# Patient Record
Sex: Male | Born: 1970 | Race: White | Hispanic: No | Marital: Married | Smoking: Current every day smoker
Health system: Southern US, Community
[De-identification: ages and names within clinical notes are randomized; demographics above are authoritative.]

## PROBLEM LIST (undated history)

## (undated) HISTORY — PX: KNEE SURGERY: SHX244

## (undated) HISTORY — PX: TOTAL HIP ARTHROPLASTY: SHX124

---

## 1999-03-09 ENCOUNTER — Encounter: Payer: Self-pay | Admitting: Emergency Medicine

## 1999-03-09 ENCOUNTER — Emergency Department (HOSPITAL_COMMUNITY): Admission: EM | Admit: 1999-03-09 | Discharge: 1999-03-09 | Payer: Self-pay | Admitting: Emergency Medicine

## 2000-02-02 ENCOUNTER — Inpatient Hospital Stay (HOSPITAL_COMMUNITY): Admission: RE | Admit: 2000-02-02 | Discharge: 2000-02-05 | Payer: Self-pay | Admitting: Orthopaedic Surgery

## 2000-05-11 ENCOUNTER — Encounter: Payer: Self-pay | Admitting: Emergency Medicine

## 2000-05-11 ENCOUNTER — Emergency Department (HOSPITAL_COMMUNITY): Admission: EM | Admit: 2000-05-11 | Discharge: 2000-05-11 | Payer: Self-pay | Admitting: Emergency Medicine

## 2000-07-21 ENCOUNTER — Inpatient Hospital Stay (HOSPITAL_COMMUNITY): Admission: RE | Admit: 2000-07-21 | Discharge: 2000-07-24 | Payer: Self-pay | Admitting: Orthopaedic Surgery

## 2002-06-03 ENCOUNTER — Emergency Department (HOSPITAL_COMMUNITY): Admission: EM | Admit: 2002-06-03 | Discharge: 2002-06-04 | Payer: Self-pay | Admitting: Emergency Medicine

## 2002-06-04 ENCOUNTER — Encounter: Payer: Self-pay | Admitting: Emergency Medicine

## 2005-02-14 ENCOUNTER — Ambulatory Visit: Payer: Self-pay | Admitting: Family Medicine

## 2005-09-11 ENCOUNTER — Ambulatory Visit: Payer: Self-pay | Admitting: Internal Medicine

## 2011-06-18 ENCOUNTER — Ambulatory Visit: Payer: Self-pay | Admitting: Family Medicine

## 2011-06-18 DIAGNOSIS — Z0289 Encounter for other administrative examinations: Secondary | ICD-10-CM

## 2012-04-30 ENCOUNTER — Emergency Department (HOSPITAL_BASED_OUTPATIENT_CLINIC_OR_DEPARTMENT_OTHER): Payer: Self-pay

## 2012-04-30 ENCOUNTER — Emergency Department (HOSPITAL_BASED_OUTPATIENT_CLINIC_OR_DEPARTMENT_OTHER)
Admission: EM | Admit: 2012-04-30 | Discharge: 2012-04-30 | Disposition: A | Discharge status: Home / Self Care | Payer: Self-pay | Attending: Emergency Medicine | Admitting: Emergency Medicine

## 2012-04-30 ENCOUNTER — Encounter (HOSPITAL_BASED_OUTPATIENT_CLINIC_OR_DEPARTMENT_OTHER): Payer: Self-pay | Admitting: *Deleted

## 2012-04-30 DIAGNOSIS — Y929 Unspecified place or not applicable: Secondary | ICD-10-CM | POA: Insufficient documentation

## 2012-04-30 DIAGNOSIS — S61219A Laceration without foreign body of unspecified finger without damage to nail, initial encounter: Secondary | ICD-10-CM

## 2012-04-30 DIAGNOSIS — F17200 Nicotine dependence, unspecified, uncomplicated: Secondary | ICD-10-CM | POA: Insufficient documentation

## 2012-04-30 DIAGNOSIS — W230XXA Caught, crushed, jammed, or pinched between moving objects, initial encounter: Secondary | ICD-10-CM | POA: Insufficient documentation

## 2012-04-30 DIAGNOSIS — Y9389 Activity, other specified: Secondary | ICD-10-CM | POA: Insufficient documentation

## 2012-04-30 DIAGNOSIS — S61209A Unspecified open wound of unspecified finger without damage to nail, initial encounter: Secondary | ICD-10-CM | POA: Insufficient documentation

## 2012-04-30 NOTE — ED Provider Notes (Addendum)
History     CSN: [id number]  Arrival date & time 04/30/12  1851   First MD Initiated Contact with Patient 04/30/12 1927      Chief Complaint  Patient presents with  . Laceration    (Consider location/radiation/quality/duration/timing/severity/associated sxs/prior treatment) Patient is a 42 y.o. male presenting with skin laceration. The history is provided by the patient.  Laceration Location:  Finger Finger laceration location:  L index finger Length (cm):  0.5 Depth:  Through dermis Laceration mechanism:  Blunt object Pain details:    Severity:  No pain Foreign body present:  No foreign bodies Tetanus status:  Out of date  Pt crushed finger between two pieces of wood History reviewed. No pertinent past medical history.  History reviewed. No pertinent past surgical history.  History reviewed. No pertinent family history.  History  Substance Use Topics  . Smoking status: Current Every Day Smoker  . Smokeless tobacco: Not on file  . Alcohol Use: No      Review of Systems  Skin: Positive for wound.  All other systems reviewed and are negative.    Allergies  Review of patient's allergies indicates no known allergies.  Home Medications  No current outpatient prescriptions on file.  BP 100/67  Pulse 72  Temp(Src) 97.6 F (36.4 C) (Oral)  Resp 20  Ht 5\' 10"  (1.778 m)  Wt 140 lb (63.504 kg)  BMI 20.09 kg/m2  SpO2 99%  Physical Exam  Nursing note and vitals reviewed. Constitutional: He is oriented to person, place, and time. He appears well-developed and well-nourished.  Musculoskeletal: He exhibits tenderness.  5mm laceration to left index finger  Neurological: He is alert and oriented to person, place, and time. He has normal reflexes.  Skin: Skin is warm.  Psychiatric: He has a normal mood and affect.    ED Course  LACERATION REPAIR Date/Time: 04/30/2012 11:19 PM Performed by: Elson Areas Authorized by: Elson Areas Consent: Verbal  consent obtained. Consent given by: patient Patient identity confirmed: verbally with patient Time out: Immediately prior to procedure a "time out" was called to verify the correct patient, procedure, equipment, support staff and site/side marked as required. Body area: upper extremity Location details: left index finger Laceration length: 0.5 cm Foreign bodies: no foreign bodies Tendon involvement: none Nerve involvement: none Anesthesia: local infiltration Skin closure: 5-0 Prolene Number of sutures: 2 Technique: simple Approximation: loose Approximation difficulty: simple Patient tolerance: Patient tolerated the procedure well with no immediate complications.   (including critical care time)  Labs Reviewed - No data to display Dg Hand Complete Left  04/30/2012  *RADIOLOGY REPORT*  Clinical Data: The patient was cutting wood. Left hand got pinched. Laceration at the base of the index finger.  LEFT HAND - COMPLETE 3+ VIEW  Comparison: None.  Findings: Three views are performed, showing no evidence for acute fracture or subluxation.  No radiopaque foreign body or soft tissue gas identified.  IMPRESSION: Negative exam.   Original Report Authenticated By: Norva Pavlov, M.D.      1. Laceration of finger of left hand, initial encounter       MDM  Suture removal in 8 days       Elson Areas, PA-C 04/30/12 2026  Lonia Skinner [place], PA-C 05/23/12 2321

## 2012-04-30 NOTE — ED Notes (Signed)
Pt states he was cutting wood and his left hand got "pinched". Approx 2 cm lac to base of left index finger. Bleeding controlled. CMS intact. Ring removed and given to wife.

## 2012-04-30 NOTE — ED Provider Notes (Signed)
Medical screening examination/treatment/procedure(s) were performed by non-physician practitioner and as supervising physician I was immediately available for consultation/collaboration.   Loren Racer, MD 04/30/12 [phone]

## 2012-05-25 NOTE — ED Provider Notes (Signed)
Medical screening examination/treatment/procedure(s) were performed by non-physician practitioner and as supervising physician I was immediately available for consultation/collaboration.   Loren Racer, MD 05/25/12 [phone]

## 2013-09-08 IMAGING — CR DG HAND COMPLETE 3+V*L*
3 series · 3 of 3 positions shown · non-contrast
Comparison: None.

CLINICAL DATA: The patient was cutting wood. Left hand got pinched.
Laceration at the base of the index finger.

LEFT HAND - COMPLETE 3+ VIEW

[x hand pa left]
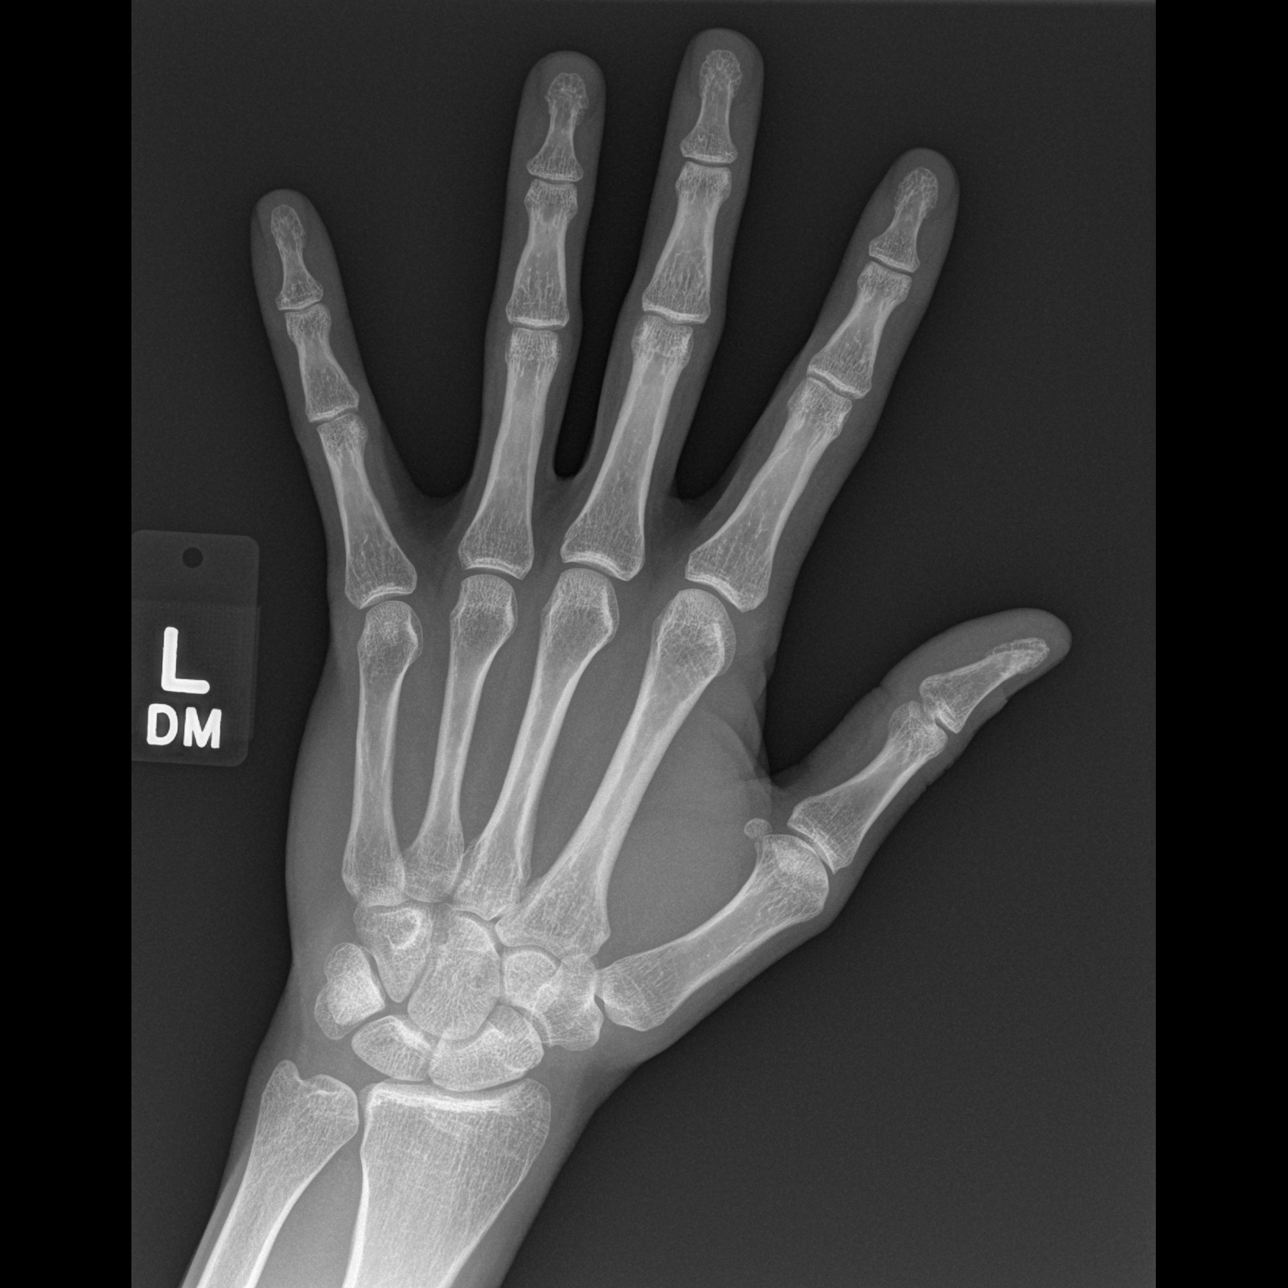

[x hand oblique left]
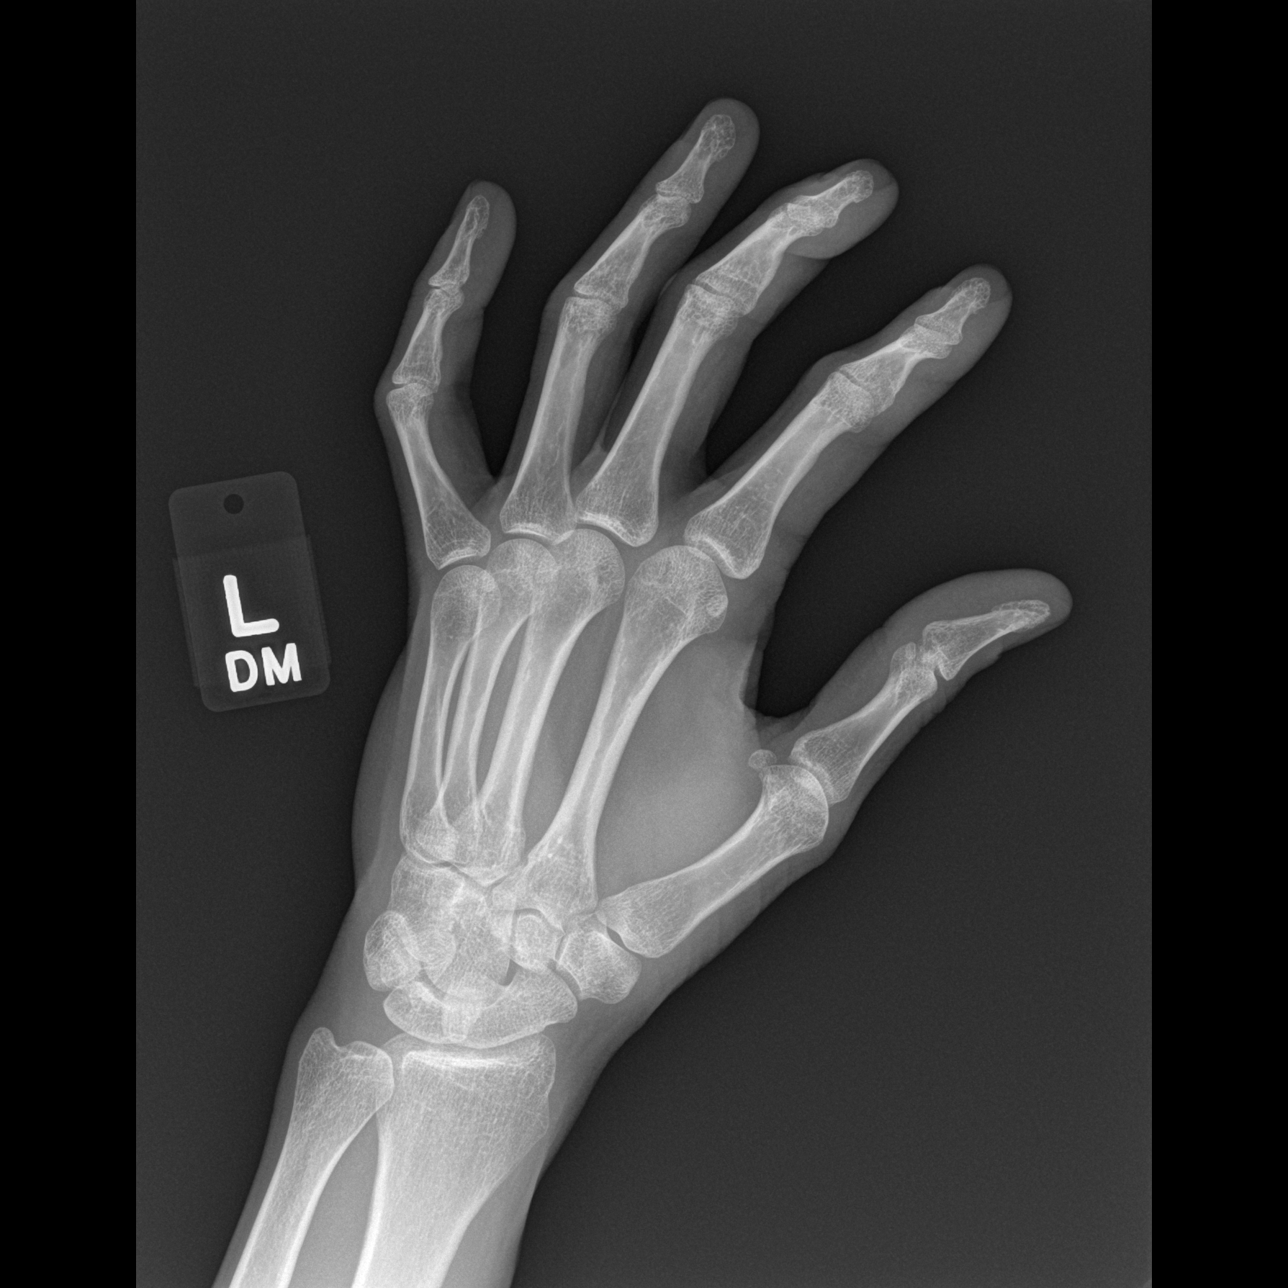

[x hand lat left]
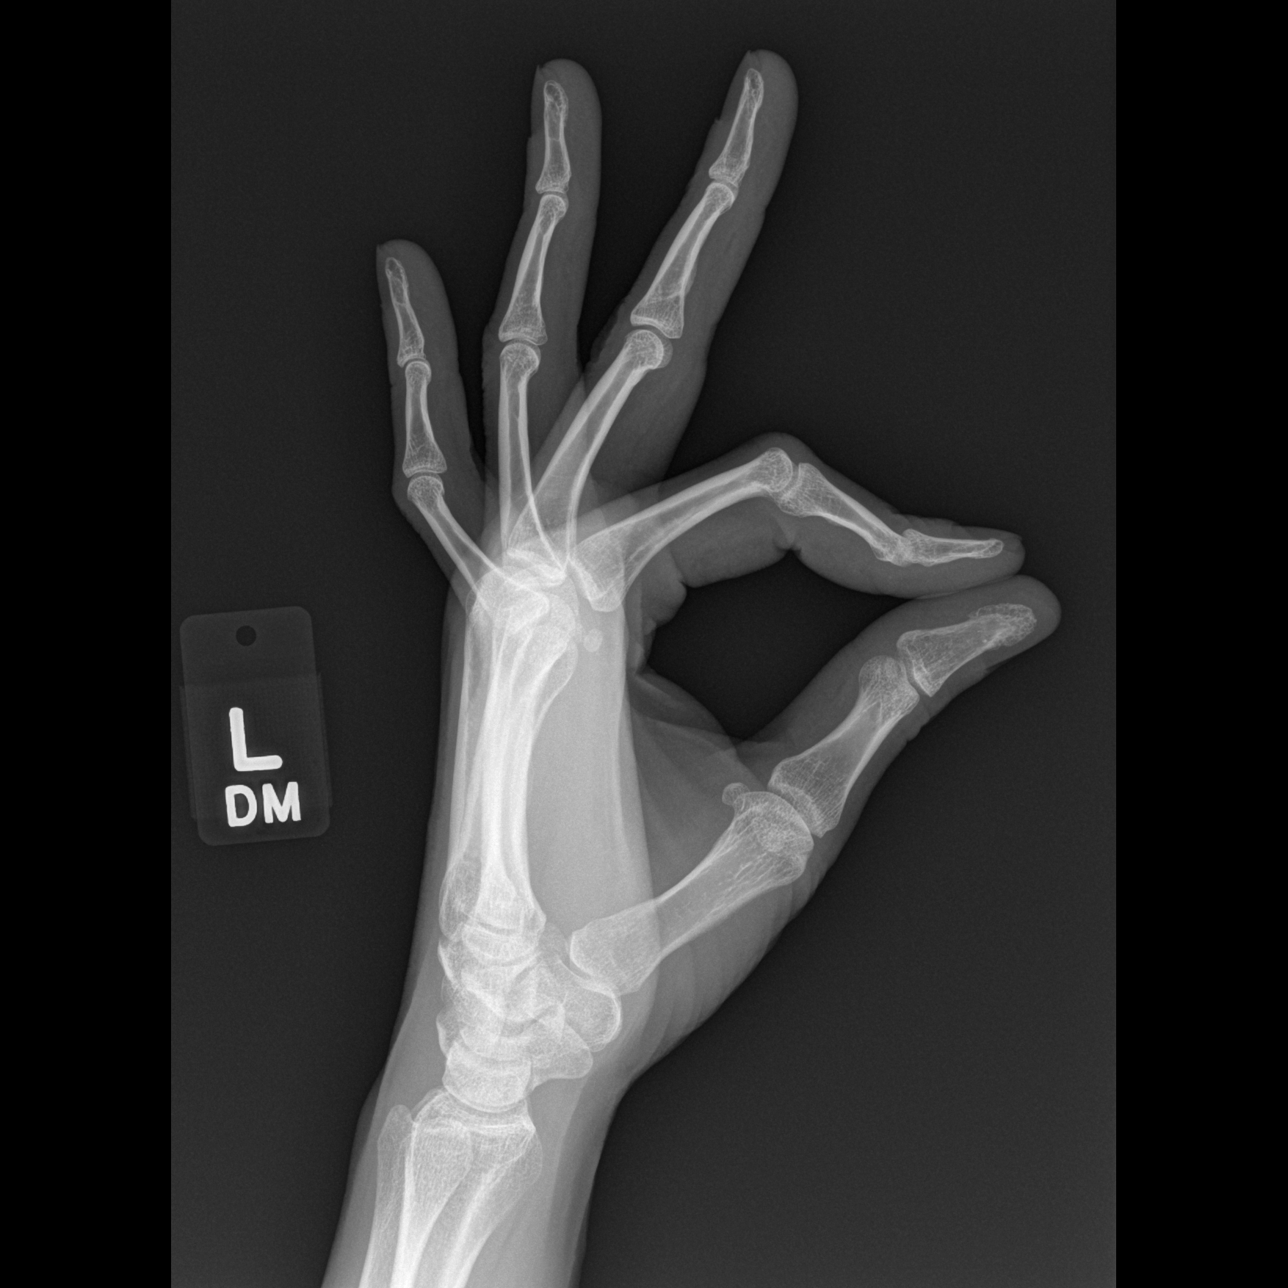

[3 of 3 positions shown; findings below may reference images not displayed]

FINDINGS: Three views are performed, showing no evidence for acute
fracture or subluxation.  No radiopaque foreign body or soft tissue
gas identified.
IMPRESSION: Negative exam.

## 2015-11-18 ENCOUNTER — Ambulatory Visit (INDEPENDENT_AMBULATORY_CARE_PROVIDER_SITE_OTHER): Payer: 59 | Admitting: Orthopaedic Surgery

## 2015-11-18 DIAGNOSIS — M25552 Pain in left hip: Secondary | ICD-10-CM | POA: Diagnosis not present

## 2017-07-05 ENCOUNTER — Ambulatory Visit: Payer: Self-pay | Admitting: Family Medicine

## 2017-07-19 ENCOUNTER — Encounter: Payer: Self-pay | Admitting: Family Medicine

## 2017-07-19 ENCOUNTER — Ambulatory Visit (INDEPENDENT_AMBULATORY_CARE_PROVIDER_SITE_OTHER): Payer: BLUE CROSS/BLUE SHIELD | Admitting: Family Medicine

## 2017-07-19 VITALS — BP 92/62 | HR 68 | Ht 70.0 in | Wt 135.8 lb

## 2017-07-19 DIAGNOSIS — E786 Lipoprotein deficiency: Secondary | ICD-10-CM | POA: Diagnosis not present

## 2017-07-19 DIAGNOSIS — F17290 Nicotine dependence, other tobacco product, uncomplicated: Secondary | ICD-10-CM

## 2017-07-19 DIAGNOSIS — F17200 Nicotine dependence, unspecified, uncomplicated: Secondary | ICD-10-CM | POA: Insufficient documentation

## 2017-07-19 NOTE — Progress Notes (Signed)
Jason Yates - 47 y.o. male MRN [id number]  Date of birth: 1970-03-22  Subjective Chief Complaint  Patient presents with  . Abnormal Lab    HPI Jason Yates is a 47 y.o. male here today to establish care with new pcp.  He has not seen a pcp in a few years but has been fairly healthy.  He has no known medical problems however is concerned about recent labs that he had completed through work.  His main concern is that his HDL is on the low end of normal (35) and he is not sure what this means.  He reports a family history of heart disease and wanted to be sure he didn't need to start medication.  He does unfortunately use nicotine products (vaping) and has not interest in quitting this at this time.  He has not had any anginal symptoms, shortness of breath, palpitations, dizziness or headache.   Outside labs sent for scanning.   ROS: ROS completed and negative except as noted per HPI  No Known Allergies  History reviewed. No pertinent past medical history.  Past Surgical History:  Procedure Laterality Date  . KNEE SURGERY    . TOTAL HIP ARTHROPLASTY      Social History   Socioeconomic History  . Marital status: Married    Spouse name: Not on file  . Number of children: Not on file  . Years of education: Not on file  . Highest education level: Not on file  Occupational History  . Not on file  Social Needs  . Financial resource strain: Not on file  . Food insecurity:    Worry: Not on file    Inability: Not on file  . Transportation needs:    Medical: Not on file    Non-medical: Not on file  Tobacco Use  . Smoking status: Current Every Day Smoker    Types: E-cigarettes  . Smokeless tobacco: Never Used  Substance and Sexual Activity  . Alcohol use: No  . Drug use: No  . Sexual activity: Not on file  Lifestyle  . Physical activity:    Days per week: Not on file    Minutes per session: Not on file  . Stress: Not on file  Relationships  . Social connections:    Talks  on phone: Not on file    Gets together: Not on file    Attends religious service: Not on file    Active member of club or organization: Not on file    Attends meetings of clubs or organizations: Not on file    Relationship status: Not on file  Other Topics Concern  . Not on file  Social History Narrative  . Not on file    History reviewed. No pertinent family history.  Health Maintenance  Topic Date Due  . HIV Screening  05/17/1985  . TETANUS/TDAP  05/17/1989  . INFLUENZA VACCINE  09/16/2017    ----------------------------------------------------------------------------------------------------------------------------------------------------------------------------------------------------------------- Physical Exam BP 92/62   Pulse 68   Ht 5\' 10"  (1.778 m)   Wt 135 lb 12.8 oz (61.6 kg)   BMI 19.49 kg/m   Physical Exam  Constitutional: He is oriented to person, place, and time. He appears well-nourished. No distress.  HENT:  Head: Normocephalic and atraumatic.  Mouth/Throat: Oropharynx is clear and moist.  Eyes: No scleral icterus.  Neck: No thyromegaly present.  Cardiovascular: Normal rate, regular rhythm and normal heart sounds.  Pulmonary/Chest: Effort normal and breath sounds normal.  Lymphadenopathy:    He has  no cervical adenopathy.  Neurological: He is oriented to person, place, and time.  Psychiatric: He has a normal mood and affect. His behavior is normal.    ------------------------------------------------------------------------------------------------------------------------------------------------------------------------------------------------------------------- Assessment and Plan  Nicotine dependence He was briefly counseled on cessation of vaping however has not interest in quitting at this time.   Low HDL (under 40) Explained differences in different cholesterol types and that HDL is preferred to be higher for cardioprotection.   Discussed ways to  improve this including regular aerobic exercise He is concerned about heart disease and I discussed with him that his biggest risk factor for heart disease is nicotine use and advised quitting.

## 2017-07-19 NOTE — Assessment & Plan Note (Signed)
Explained differences in different cholesterol types and that HDL is preferred to be higher for cardioprotection.   Discussed ways to improve this including regular aerobic exercise He is concerned about heart disease and I discussed with him that his biggest risk factor for heart disease is nicotine use and advised quitting.

## 2017-07-19 NOTE — Assessment & Plan Note (Signed)
He was briefly counseled on cessation of vaping however has not interest in quitting at this time.

## 2017-07-19 NOTE — Patient Instructions (Signed)
It was nice to meet you!  Cholesterol Cholesterol is a white, waxy, fat-like substance that is needed by the human body in small amounts. The liver makes all the cholesterol we need. Cholesterol is carried from the liver by the blood through the blood vessels. Deposits of cholesterol (plaques) may build up on blood vessel (artery) walls. Plaques make the arteries narrower and stiffer. Cholesterol plaques increase the risk for heart attack and stroke. You cannot feel your cholesterol level even if it is very high. The only way to know that it is high is to have a blood test. Once you know your cholesterol levels, you should keep a record of the test results. Work with your health care provider to keep your levels in the desired range. What do the results mean?  Total cholesterol is a rough measure of all the cholesterol in your blood.  LDL (low-density lipoprotein) is the "bad" cholesterol. This is the type that causes plaque to build up on the artery walls. You want this level to be low.  HDL (high-density lipoprotein) is the "good" cholesterol because it cleans the arteries and carries the LDL away. You want this level to be high.  Triglycerides are fat that the body can either burn for energy or store. High levels are closely linked to heart disease. What are the desired levels of cholesterol?  Total cholesterol below 200.  LDL below 100 for people who are at risk, below 70 for people at very high risk.  HDL above 40 is good. A level of 60 or higher is considered to be protective against heart disease.  Triglycerides below 150. How can I lower my cholesterol? Diet Follow your diet program as told by your health care provider.  Choose fish or white meat chicken and [place], roasted or baked. Limit fatty cuts of red meat, fried foods, and processed meats, such as sausage and lunch meats.  Eat lots of fresh fruits and vegetables.  Choose whole grains, beans, pasta, potatoes, and  cereals.  Choose olive oil, corn oil, or canola oil, and use only small amounts.  Avoid butter, mayonnaise, shortening, or palm kernel oils.  Avoid foods with trans fats.  Drink skim or nonfat milk and eat low-fat or nonfat yogurt and cheeses. Avoid whole milk, cream, ice cream, egg yolks, and full-fat cheeses.  Healthier desserts include angel food cake, ginger snaps, animal crackers, hard candy, popsicles, and low-fat or nonfat frozen yogurt. Avoid pastries, cakes, pies, and cookies.  Exercise  Follow your exercise program as told by your health care provider. A regular program: ? Helps to decrease LDL and raise HDL. ? Helps with weight control.  Do things that increase your activity level, such as gardening, walking, and taking the stairs.  Ask your health care provider about ways that you can be more active in your daily life.  Medicine  Take over-the-counter and prescription medicines only as told by your health care provider. ? Medicine may be prescribed by your health care provider to help lower cholesterol and decrease the risk for heart disease. This is usually done if diet and exercise have failed to bring down cholesterol levels. ? If you have several risk factors, you may need medicine even if your levels are normal.  This information is not intended to replace advice given to you by your health care provider. Make sure you discuss any questions you have with your health care provider. Document Released: 10/28/2000 Document Revised: 08/31/2015 Document Reviewed: 08/03/2015 Elsevier Interactive Patient  Education  2018 [employer]  

## 2018-01-17 ENCOUNTER — Ambulatory Visit: Payer: BLUE CROSS/BLUE SHIELD | Admitting: Family Medicine

## 2018-01-17 ENCOUNTER — Encounter: Payer: Self-pay | Admitting: Family Medicine

## 2018-01-17 DIAGNOSIS — F524 Premature ejaculation: Secondary | ICD-10-CM | POA: Diagnosis not present

## 2018-01-17 MED ORDER — PAROXETINE HCL 10 MG PO TABS: 10.0000 mg | ORAL_TABLET | Freq: Every day | ORAL | 1 refills | Status: DC

## 2018-01-17 NOTE — Progress Notes (Signed)
Jason Yates - 47 y.o. male MRN [id number]  Date of birth: 05/16/1970  Subjective Chief Complaint  Patient presents with  . 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 male here today with complaint of sexual dysfunction.  He reports that he is having problems with premature ejaculation.  He states that this started a few months ago.  Up until that point he did not have any problems.  He reports that from penetration to ejaculation it takes 1-2 minutes, sometimes less.  He denies any difficulty achieving or maintaining an erection.  He denies new sexual partners.  He denies pain with ejaculation.  He has not tried any treatments so far.   ROS:  A comprehensive ROS was completed and negative except as noted per HPI  No Known Allergies  No past medical history on file.  Past Surgical History:  Procedure Laterality Date  . KNEE SURGERY    . TOTAL HIP ARTHROPLASTY      Social History   Socioeconomic History  . Marital status: Married    Spouse name: Not on file  . Number of children: Not on file  . Years of education: Not on file  . Highest education level: Not on file  Occupational History  . Not on file  Social Needs  . Financial resource strain: Not on file  . Food insecurity:    Worry: Not on file    Inability: Not on file  . Transportation needs:    Medical: Not on file    Non-medical: Not on file  Tobacco Use  . Smoking status: Current Every Day Smoker    Types: E-cigarettes  . Smokeless tobacco: Never Used  Substance and Sexual Activity  . Alcohol use: No  . Drug use: No  . Sexual activity: Not on file  Lifestyle  . Physical activity:    Days per week: Not on file    Minutes per session: Not on file  . Stress: Not on file  Relationships  . Social connections:    Talks on phone: Not on file    Gets together: Not on file    Attends religious service: Not on file    Active member of club or  organization: Not on file    Attends meetings of clubs or organizations: Not on file    Relationship status: Not on file  Other Topics Concern  . Not on file  Social History Narrative  . Not on file    Family History  Problem Relation Age of Onset  . Cancer Mother   . Cancer Father   . Heart attack Father   . Heart disease Father   . Hyperlipidemia Father   . Hypertension Father     Health Maintenance  Topic Date Due  . HIV Screening  05/17/1985  . TETANUS/TDAP  05/17/1989  . INFLUENZA VACCINE  Completed    ----------------------------------------------------------------------------------------------------------------------------------------------------------------------------------------------------------------- Physical Exam BP 110/62   Pulse 81   Temp 97.7 F (36.5 C) (Oral)   Ht 5\' 10"  (1.778 m)   Wt 135 lb (61.2 kg)   SpO2 98%   BMI 19.37 kg/m   Physical Exam  Constitutional: He is oriented to person, place, and time. He appears well-nourished. No distress.  HENT:  Head: Normocephalic and atraumatic.  Eyes: No scleral icterus.  Neck: Neck supple. No thyromegaly present.  Cardiovascular: Normal rate, regular rhythm and normal heart sounds.  Pulmonary/Chest: Effort normal and  breath sounds normal.  Lymphadenopathy:    He has no cervical adenopathy.  Neurological: He is alert and oriented to person, place, and time.  Skin: Skin is warm and dry.  Psychiatric: He has a normal mood and affect. His behavior is normal.    ------------------------------------------------------------------------------------------------------------------------------------------------------------------------------------------------------------------- Assessment and Plan  Premature ejaculation -Discussed treatment options including topical analgesics, SSRI or PDE5 inhibitor -Would like to try SSRI, paxil sent in. Start 10mg , may increase to 20mg  after 3-4 weeks if needed.  -F/u in  10-12 weeks.

## 2018-01-17 NOTE — Patient Instructions (Addendum)
Start paroxetine 10mg  daily If no improvement after 3-4 weeks you may increase to 20mg  (2 tabs) I will see you back in about 9-12 weeks or sooner if needed.

## 2018-01-17 NOTE — Assessment & Plan Note (Signed)
-  Discussed treatment options including topical analgesics, SSRI or PDE5 inhibitor -Would like to try SSRI, paxil sent in. Start 10mg , may increase to 20mg  after 3-4 weeks if needed.  -F/u in 10-12 weeks.

## 2018-03-08 ENCOUNTER — Other Ambulatory Visit: Payer: Self-pay | Admitting: Family Medicine

## 2018-03-14 ENCOUNTER — Encounter: Payer: Self-pay | Admitting: Family Medicine

## 2018-03-14 ENCOUNTER — Ambulatory Visit: Payer: BLUE CROSS/BLUE SHIELD | Admitting: Family Medicine

## 2018-03-14 DIAGNOSIS — F524 Premature ejaculation: Secondary | ICD-10-CM

## 2018-03-14 MED ORDER — PAROXETINE HCL 20 MG PO TABS: ORAL_TABLET | ORAL | 1 refills | Status: DC

## 2018-03-14 NOTE — Progress Notes (Signed)
Jason Yates - 48 y.o. male MRN [id number]  Date of birth: [birth year]  Subjective Chief Complaint  Patient presents with  . Medication Refill    HPI Jason Yates is a 48 y.o. male here today for follow up of premature ejaculation.  He reports that he had good results initially with paxil however subsequent times he has experienced continued episodes of premature ejaculation.  He denies any issues with tolerating medication.    ROS:  A comprehensive ROS was completed and negative except as noted per HPI  No Known Allergies  No past medical history on file.  Past Surgical History:  Procedure Laterality Date  . KNEE SURGERY    . TOTAL HIP ARTHROPLASTY      Social History   Socioeconomic History  . Marital status: Married    Spouse name: Not on file  . Number of children: Not on file  . Years of education: Not on file  . Highest education level: Not on file  Occupational History  . Not on file  Social Needs  . Financial resource strain: Not on file  . Food insecurity:    Worry: Not on file    Inability: Not on file  . Transportation needs:    Medical: Not on file    Non-medical: Not on file  Tobacco Use  . Smoking status: Current Every Day Smoker    Types: E-cigarettes  . Smokeless tobacco: Never Used  Substance and Sexual Activity  . Alcohol use: No  . Drug use: No  . Sexual activity: Not on file  Lifestyle  . Physical activity:    Days per week: Not on file    Minutes per session: Not on file  . Stress: Not on file  Relationships  . Social connections:    Talks on phone: Not on file    Gets together: Not on file    Attends religious service: Not on file    Active member of club or organization: Not on file    Attends meetings of clubs or organizations: Not on file    Relationship status: Not on file  Other Topics Concern  . Not on file  Social History Narrative  . Not on file    Family History  Problem Relation Age of Onset  . Cancer Mother   .  Cancer Father   . Heart attack Father   . Heart disease Father   . Hyperlipidemia Father   . Hypertension Father     Health Maintenance  Topic Date Due  . HIV Screening  05/17/1985  . TETANUS/TDAP  05/17/1989  . INFLUENZA VACCINE  Completed    ----------------------------------------------------------------------------------------------------------------------------------------------------------------------------------------------------------------- Physical Exam BP 122/68   Pulse 60   Temp 98.1 F (36.7 C) (Oral)   Ht 5\' 10"  (1.778 m)   Wt 136 lb (61.7 kg)   SpO2 97%   BMI 19.51 kg/m   Physical Exam Constitutional:      Appearance: Normal appearance.  Eyes:     General: No scleral icterus. Cardiovascular:     Rate and Rhythm: Normal rate and regular rhythm.  Pulmonary:     Effort: Pulmonary effort is normal.     Breath sounds: Normal breath sounds.  Skin:    General: Skin is warm and dry.     Findings: No rash.  Neurological:     General: No focal deficit present.     Mental Status: He is alert.  Psychiatric:        Mood and Affect: Mood  normal.        Behavior: Behavior normal.     ------------------------------------------------------------------------------------------------------------------------------------------------------------------------------------------------------------------- Assessment and Plan  Premature ejaculation -Some improvement with paxil initially but now experiencing issues again -Will continue to titrate paxil to 20mg .

## 2018-03-14 NOTE — Assessment & Plan Note (Signed)
-  Some improvement with paxil initially but now experiencing issues again -Will continue to titrate paxil to 20mg .

## 2018-09-11 ENCOUNTER — Other Ambulatory Visit: Payer: Self-pay | Admitting: Family Medicine

## 2018-09-12 NOTE — Telephone Encounter (Signed)
LOV 02/2018. QT#30 R0 with a F/U of 6 mo appt ?

## 2019-04-01 ENCOUNTER — Other Ambulatory Visit: Payer: Self-pay | Admitting: Family Medicine

## 2020-09-16 DEATH — deceased
